# Patient Record
Sex: Female | Born: 2005
Health system: Southern US, Community
[De-identification: ages and names within clinical notes are randomized; demographics above are authoritative.]

## PROBLEM LIST (undated history)

## (undated) DIAGNOSIS — E301 Precocious puberty: Secondary | ICD-10-CM

## (undated) HISTORY — PX: TONSILLECTOMY: SUR1361

---
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
## 2024-02-06 ENCOUNTER — Telehealth: Payer: Self-pay | Admitting: *Deleted

## 2024-02-06 NOTE — Telephone Encounter (Signed)
 Taylor Wu called nurse line for refill of Birth Control pills. Last well visit >1 year ago and she lives in [place] now. Ask her to check with [facility] at her school for assistance or find local PCP in [place] for care.

## 2024-02-28 DIAGNOSIS — Z113 Encounter for screening for infections with a predominantly sexual mode of transmission: Secondary | ICD-10-CM | POA: Diagnosis not present

## 2024-02-28 DIAGNOSIS — Z13220 Encounter for screening for lipoid disorders: Secondary | ICD-10-CM | POA: Diagnosis not present

## 2024-02-28 DIAGNOSIS — N760 Acute vaginitis: Secondary | ICD-10-CM | POA: Diagnosis not present

## 2024-02-28 DIAGNOSIS — Z30011 Encounter for initial prescription of contraceptive pills: Secondary | ICD-10-CM | POA: Diagnosis not present

## 2024-02-28 DIAGNOSIS — Z130 Encounter for screening for diseases of the blood and blood-forming organs and certain disorders involving the immune mechanism: Secondary | ICD-10-CM | POA: Diagnosis not present

## 2024-03-15 DIAGNOSIS — F12929 Cannabis use, unspecified with intoxication, unspecified: Secondary | ICD-10-CM | POA: Diagnosis not present

## 2024-03-15 DIAGNOSIS — Z79899 Other long term (current) drug therapy: Secondary | ICD-10-CM | POA: Diagnosis not present

## 2024-03-15 DIAGNOSIS — T391X2A Poisoning by 4-Aminophenol derivatives, intentional self-harm, initial encounter: Secondary | ICD-10-CM | POA: Diagnosis not present

## 2024-03-15 DIAGNOSIS — F1290 Cannabis use, unspecified, uncomplicated: Secondary | ICD-10-CM | POA: Diagnosis not present

## 2024-03-15 DIAGNOSIS — T40712A Poisoning by cannabis, intentional self-harm, initial encounter: Secondary | ICD-10-CM | POA: Diagnosis not present

## 2024-03-15 DIAGNOSIS — F4325 Adjustment disorder with mixed disturbance of emotions and conduct: Secondary | ICD-10-CM | POA: Diagnosis not present

## 2024-03-15 DIAGNOSIS — F12920 Cannabis use, unspecified with intoxication, uncomplicated: Secondary | ICD-10-CM | POA: Diagnosis not present

## 2024-03-15 DIAGNOSIS — Z9152 Personal history of nonsuicidal self-harm: Secondary | ICD-10-CM | POA: Diagnosis not present

## 2024-03-15 DIAGNOSIS — R9431 Abnormal electrocardiogram [ECG] [EKG]: Secondary | ICD-10-CM | POA: Diagnosis not present

## 2024-03-15 DIAGNOSIS — Z1152 Encounter for screening for COVID-19: Secondary | ICD-10-CM | POA: Diagnosis not present

## 2024-03-15 DIAGNOSIS — Z818 Family history of other mental and behavioral disorders: Secondary | ICD-10-CM | POA: Diagnosis not present

## 2024-03-15 DIAGNOSIS — R45851 Suicidal ideations: Secondary | ICD-10-CM | POA: Diagnosis not present

## 2024-03-15 DIAGNOSIS — F411 Generalized anxiety disorder: Secondary | ICD-10-CM | POA: Diagnosis not present
